# Patient Record
Sex: Female | Born: 2000 | Race: Black or African American | Hispanic: No | Marital: Single | Smoking: Never smoker
Health system: Southern US, Community
[De-identification: ages and names within clinical notes are randomized; demographics above are authoritative.]

## PROBLEM LIST (undated history)

## (undated) DIAGNOSIS — D571 Sickle-cell disease without crisis: Secondary | ICD-10-CM

---
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
## 2020-04-20 ENCOUNTER — Emergency Department (HOSPITAL_COMMUNITY): Payer: Medicaid Other

## 2020-04-20 ENCOUNTER — Emergency Department (HOSPITAL_COMMUNITY)
Admission: EM | Admit: 2020-04-20 | Discharge: 2020-04-20 | Disposition: A | Discharge status: Home / Self Care | Payer: Medicaid Other | Attending: Emergency Medicine | Admitting: Emergency Medicine

## 2020-04-20 ENCOUNTER — Encounter (HOSPITAL_COMMUNITY): Payer: Self-pay

## 2020-04-20 ENCOUNTER — Other Ambulatory Visit: Payer: Self-pay

## 2020-04-20 DIAGNOSIS — R5383 Other fatigue: Secondary | ICD-10-CM | POA: Insufficient documentation

## 2020-04-20 DIAGNOSIS — R059 Cough, unspecified: Secondary | ICD-10-CM | POA: Insufficient documentation

## 2020-04-20 DIAGNOSIS — D5700 Hb-SS disease with crisis, unspecified: Secondary | ICD-10-CM | POA: Insufficient documentation

## 2020-04-20 DIAGNOSIS — R0602 Shortness of breath: Secondary | ICD-10-CM | POA: Insufficient documentation

## 2020-04-20 DIAGNOSIS — R071 Chest pain on breathing: Secondary | ICD-10-CM

## 2020-04-20 DIAGNOSIS — Z20822 Contact with and (suspected) exposure to covid-19: Secondary | ICD-10-CM | POA: Insufficient documentation

## 2020-04-20 DIAGNOSIS — R0789 Other chest pain: Secondary | ICD-10-CM | POA: Diagnosis present

## 2020-04-20 LAB — COMPREHENSIVE METABOLIC PANEL
ALT: 52 U/L — ABNORMAL HIGH (ref 0–44)
AST: 72 U/L — ABNORMAL HIGH (ref 15–41)
Albumin: 4.3 g/dL (ref 3.5–5.0)
Alkaline Phosphatase: 54 U/L (ref 38–126)
Anion gap: 7 (ref 5–15)
BUN: 8 mg/dL (ref 6–20)
CO2: 25 mmol/L (ref 22–32)
Calcium: 8.9 mg/dL (ref 8.9–10.3)
Chloride: 103 mmol/L (ref 98–111)
Creatinine, Ser: 0.37 mg/dL — ABNORMAL LOW (ref 0.44–1.00)
GFR, Estimated: >60 mL/min (ref >60)
Glucose, Bld: 107 mg/dL — ABNORMAL HIGH (ref 70–99)
Potassium: 5.2 mmol/L — ABNORMAL HIGH (ref 3.5–5.1)
Sodium: 135 mmol/L (ref 135–145)
Total Bilirubin: 1.1 mg/dL (ref 0.3–1.2)
Total Protein: 8.6 g/dL — ABNORMAL HIGH (ref 6.5–8.1)

## 2020-04-20 LAB — D-DIMER, QUANTITATIVE: D-Dimer, Quant: 1.77 ug/mL-FEU — ABNORMAL HIGH (ref 0.00–0.50)

## 2020-04-20 LAB — CBC WITH DIFFERENTIAL/PLATELET
Abs Immature Granulocytes: 0.05 10*3/uL (ref 0.00–0.07)
Basophils Absolute: 0.1 10*3/uL (ref 0.0–0.1)
Basophils Relative: 1 %
Eosinophils Absolute: 0.2 10*3/uL (ref 0.0–0.5)
Eosinophils Relative: 2 %
HCT: 24.2 % — ABNORMAL LOW (ref 36.0–46.0)
Hemoglobin: 8.5 g/dL — ABNORMAL LOW (ref 12.0–15.0)
Immature Granulocytes: 1 %
Lymphocytes Relative: 33 %
Lymphs Abs: 3.6 10*3/uL (ref 0.7–4.0)
MCH: 30 pg (ref 26.0–34.0)
MCHC: 35.1 g/dL (ref 30.0–36.0)
MCV: 85.5 fL (ref 80.0–100.0)
Monocytes Absolute: 1.2 10*3/uL — ABNORMAL HIGH (ref 0.1–1.0)
Monocytes Relative: 11 %
Neutro Abs: 5.8 10*3/uL (ref 1.7–7.7)
Neutrophils Relative %: 52 %
Platelets: 438 10*3/uL — ABNORMAL HIGH (ref 150–400)
RBC: 2.83 MIL/uL — ABNORMAL LOW (ref 3.87–5.11)
RDW: 18.6 % — ABNORMAL HIGH (ref 11.5–15.5)
WBC: 10.8 10*3/uL — ABNORMAL HIGH (ref 4.0–10.5)
nRBC: 0.2 % (ref 0.0–0.2)

## 2020-04-20 LAB — URINALYSIS, ROUTINE W REFLEX MICROSCOPIC
Bacteria, UA: NONE SEEN
Bilirubin Urine: NEGATIVE
Glucose, UA: NEGATIVE mg/dL
Hgb urine dipstick: NEGATIVE
Ketones, ur: NEGATIVE mg/dL
Leukocytes,Ua: NEGATIVE
Nitrite: NEGATIVE
Protein, ur: NEGATIVE mg/dL
Specific Gravity, Urine: 1.011 (ref 1.005–1.030)
pH: 6 (ref 5.0–8.0)

## 2020-04-20 LAB — RETICULOCYTES
Immature Retic Fract: 29.7 % — ABNORMAL HIGH (ref 2.3–15.9)
RBC.: 2.86 MIL/uL — ABNORMAL LOW (ref 3.87–5.11)
Retic Count, Absolute: 270 10*3/uL — ABNORMAL HIGH (ref 19.0–186.0)
Retic Ct Pct: 9.5 % — ABNORMAL HIGH (ref 0.4–3.1)

## 2020-04-20 LAB — PROTIME-INR
INR: 1.1 (ref 0.8–1.2)
Prothrombin Time: 14.2 seconds (ref 11.4–15.2)

## 2020-04-20 LAB — RESPIRATORY PANEL BY RT PCR (FLU A&B, COVID)
Influenza A by PCR: NEGATIVE
Influenza B by PCR: NEGATIVE
SARS Coronavirus 2 by RT PCR: NEGATIVE

## 2020-04-20 LAB — APTT: aPTT: 29 seconds (ref 24–36)

## 2020-04-20 LAB — I-STAT BETA HCG BLOOD, ED (MC, WL, AP ONLY): I-stat hCG, quantitative: <5 m[IU]/mL (ref <5)

## 2020-04-20 LAB — GROUP A STREP BY PCR: Group A Strep by PCR: NOT DETECTED

## 2020-04-20 LAB — LACTIC ACID, PLASMA
Lactic Acid, Venous: 1 mmol/L (ref 0.5–1.9)
Lactic Acid, Venous: 0.9 mmol/L (ref 0.5–1.9)

## 2020-04-20 LAB — TROPONIN I (HIGH SENSITIVITY)
Troponin I (High Sensitivity): 2 ng/L (ref <18)
Troponin I (High Sensitivity): 3 ng/L (ref <18)

## 2020-04-20 MED ORDER — DEXTROSE-NACL 5-0.45 % IV SOLN: INTRAVENOUS | Status: DC

## 2020-04-20 MED ORDER — OXYCODONE HCL 5 MG PO TABS
5.0000 mg | ORAL_TABLET | Freq: Once | ORAL | Status: DC
  Filled 2020-04-20: qty 1

## 2020-04-20 MED ORDER — IOHEXOL 350 MG/ML SOLN
100.0000 mL | Freq: Once | INTRAVENOUS | Status: AC | PRN
  Administered 2020-04-20: 100 mL via INTRAVENOUS

## 2020-04-20 MED ORDER — HYDROMORPHONE HCL 1 MG/ML IJ SOLN
1.0000 mg | Freq: Once | INTRAMUSCULAR | Status: AC
  Administered 2020-04-20: 1 mg via INTRAVENOUS
  Filled 2020-04-20: qty 1

## 2020-04-20 NOTE — Discharge Instructions (Signed)
1.  Call your doctor to get a recheck within the next 1 to 4 days. 2.  Continue your home pain medication regimen. 3.  There is a very small amount of fluid next to the lung on the right.  This is likely causing your pain.  At this time, there is no sign of a blood clot or infection in this area.  Return to the emergency department immediately if you develop a fever, increased shortness of breath, worsening pain or other concerning symptoms.

## 2020-04-20 NOTE — ED Triage Notes (Signed)
Patient c/o sickle cell pain in her right chest and right rib cage area  X 4 days and SOB.

## 2020-04-20 NOTE — ED Provider Notes (Signed)
[facility]-EMERGENCY DEPT Provider Note   CSN: [id number] Arrival date & time: 04/20/20  1531     History Chief Complaint  Patient presents with  . Sickle Cell Pain Crisis    Leslie Kelly is a [age] y.o. female.  HPI Patient reports she has had pain in the right side of her posterior thoracic chest for about 4 days.  She reports initially she had some pain in her lower back centrally.  She reports that has migrated up to her central back and now is more concentrated to the right lower and mid thoracic back wrapping around to the front of the chest.  She reports the pain is sharp in nature.  It is worse with coughing, laughing, deep breaths.  She reports that she is sleeping on some pillows because is uncomfortable to lie on the area.  She reports she thought it might have been due to a back strain.  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  However, she has been taking ibuprofen around-the-clock and taking her oxycodone at night without relief.  She thought it would have gotten better by now thought was a back strain.  She reports she has some mild cough.  She has felt slightly more short of breath than usual.  She reports she gets fatigued and a little short of breath if she climbs 3 flights of stairs.  She also has had some sore throat.  She is been trying to drink tea to alleviate it.  She recently got her first Covid vaccine within the last week.  No lower extremity swelling or calf pain.  No history of PE or DVT.  No abdominal pain, nausea or vomiting.  No GU symptoms.    Past Medical History:  Diagnosis Date  . Sickle cell anemia (HCC)     There are no problems to display for this patient.   History reviewed. No pertinent surgical history.   OB History   No obstetric history on file.     Family History  Problem Relation Age of Onset  . Asthma Mother     Social History   Tobacco Use  . Smoking status: Never Smoker  . Smokeless  tobacco: Never Used  Vaping Use  . Vaping Use: Never used  Substance Use Topics  . Alcohol use: Never  . Drug use: Never    Home Medications Prior to Admission medications   Medication Sig Start Date End Date Taking? Authorizing Provider  fluticasone (FLONASE) 50 MCG/ACT nasal spray Place 2 sprays into the nose daily. 06/26/17  Yes [provider]  folic acid (FOLVITE) 1 MG tablet Take 1 mg by mouth daily. 07/22/17  Yes [provider]  hydroxyurea (HYDREA) 500 MG capsule Take 1,500 mg by mouth daily. 06/18/18  Yes [provider]  ibuprofen (ADVIL,MOTRIN) 400 MG tablet Take 400 mg by mouth every 6 (six) hours as needed for moderate pain.  06/18/18  Yes [provider]  oxyCODONE (OXY IR/ROXICODONE) 5 MG immediate release tablet Take 5 mg by mouth every 4 (four) hours as needed for severe pain.  06/18/18  Yes [provider]    Allergies    Patient has no known allergies.  Review of Systems   Review of Systems 10 systems reviewed and negative except as per HPI Physical Exam Updated Vital Signs BP (!) 182/103   Pulse 68   Temp 99.1 F (37.3 C) (Oral)   Resp (!) 22   Ht 5'  2" (1.575 m)   Wt 54.4 kg   SpO2 99%   BMI 21.95 kg/m   Physical Exam Constitutional:      Comments: Alert nontoxic and clinically well in appearance.  No respiratory distress.  Well-nourished well-developed.  HENT:     Head: Normocephalic and atraumatic.     Nose: Nose normal.     Mouth/Throat:     Comments: Mucous membranes are pink and moist.  Patient does have a large tonsils bilaterally. Eyes:     Extraocular Movements: Extraocular movements intact.     Conjunctiva/sclera: Conjunctivae normal.     Pupils: Pupils are equal, round, and reactive to light.  Cardiovascular:     Rate and Rhythm: Normal rate and regular rhythm.     Heart sounds: Normal heart sounds.  Pulmonary:     Effort: Pulmonary effort is normal.     Breath sounds: Normal breath  sounds.     Comments: Reproducible chest wall tenderness to palpation of the thoracic back from the mid rib cage to the lower rib cage posteriorly to anteriorly.  No palpable soft tissue abnormalities.  No crepitus.  No rash. Chest:     Chest wall: Tenderness present.  Abdominal:     General: There is no distension.     Palpations: Abdomen is soft.     Tenderness: There is no abdominal tenderness. There is no guarding.  Musculoskeletal:        General: No swelling or tenderness. Normal range of motion.     Cervical back: Neck supple.     Right lower leg: No edema.     Left lower leg: No edema.  Skin:    General: Skin is warm and dry.     Findings: No rash.  Neurological:     General: No focal deficit present.     Mental Status: She is oriented to person, place, and time.     Coordination: Coordination normal.  Psychiatric:        Mood and Affect: Mood normal.     ED Results / Procedures / Treatments   Labs (all labs ordered are listed, but only abnormal results are displayed) Labs Reviewed  COMPREHENSIVE METABOLIC PANEL - Abnormal; Notable for the following components:      Result Value   Potassium 5.2 (*)    Glucose, Bld 107 (*)    Creatinine, Ser 0.37 (*)    Total Protein 8.6 (*)    AST 72 (*)    ALT 52 (*)    All other components within normal limits  CBC WITH DIFFERENTIAL/PLATELET - Abnormal; Notable for the following components:   WBC 10.8 (*)    RBC 2.83 (*)    Hemoglobin 8.5 (*)    HCT 24.2 (*)    RDW 18.6 (*)    Platelets 438 (*)    Monocytes Absolute 1.2 (*)    All other components within normal limits  RETICULOCYTES - Abnormal; Notable for the following components:   Retic Ct Pct 9.5 (*)    RBC. 2.86 (*)    Retic Count, Absolute 270.0 (*)    Immature Retic Fract 29.7 (*)    All other components within normal limits  D-DIMER, QUANTITATIVE (NOT AT [facility]) - Abnormal; Notable for the following components:   D-Dimer, Quant 1.77 (*)    All other components  within normal limits  RESPIRATORY PANEL BY RT PCR (FLU A&B, COVID)  GROUP A STREP BY PCR  LACTIC ACID, PLASMA  LACTIC ACID, PLASMA  PROTIME-INR  APTT  URINALYSIS, ROUTINE W REFLEX MICROSCOPIC  I-STAT BETA HCG BLOOD, ED ([facility], [facility], [facility] ONLY)  TROPONIN I (HIGH SENSITIVITY)  TROPONIN I (HIGH SENSITIVITY)    EKG EKG Interpretation  Date/Time:  Thursday April 20 2020 15:47:04 EDT Ventricular Rate:  85 PR Interval:    QRS Duration: 74 QT Interval:  359 QTC Calculation: 427 R Axis:   77 Text Interpretation: Sinus rhythm Borderline T wave abnormalities probable LVH, no acute ischemic appearance. no old comparison Confirmed by Arby Barrette [phone]) on 04/20/2020 5:58:28 PM   Radiology DG Chest 2 View  Result Date: 04/20/2020 CLINICAL DATA:  Chest pain on the right for several days, initial encounter EXAM: CHEST - 2 VIEW COMPARISON:  None. FINDINGS: Cardiac shadow is within normal limits. The lungs are. No bony abnormality is seen. IMPRESSION: No acute abnormality noted. Electronically Signed   By: Alcide Clever M.D.   On: 04/20/2020 16:18   CT Angio Chest PE W/Cm &/Or Wo Cm  Result Date: 04/20/2020 CLINICAL DATA:  History of sickle cell with right-sided chest pain and shortness of breath EXAM: CT ANGIOGRAPHY CHEST WITH CONTRAST TECHNIQUE: Multidetector CT imaging of the chest was performed using the standard protocol during bolus administration of intravenous contrast. Multiplanar CT image reconstructions and MIPs were obtained to evaluate the vascular anatomy. CONTRAST:  OMNIPAQUE IOHEXOL 350 MG/ML SOLN COMPARISON:  Chest x-ray from earlier in the same day. FINDINGS: Cardiovascular: Thoracic aorta demonstrates no aneurysmal dilatation or dissection. No cardiac enlargement is noted. No coronary calcifications are seen. The pulmonary artery shows a normal branching pattern bilaterally. No filling defect to suggest pulmonary embolism is noted. Mediastinum/Nodes: Thoracic inlet is within  normal limits. No hilar or mediastinal adenopathy is noted. The esophagus as visualized is within normal limits. Lungs/Pleura: Small right-sided pleural effusion is noted. Minimal bibasilar atelectasis is seen. No focal parenchymal nodules are noted. Upper Abdomen: No acute abnormality. Musculoskeletal: No chest wall abnormality. No acute or significant osseous findings. Review of the MIP images confirms the above findings. IMPRESSION: No evidence of pulmonary emboli. Small right pleural effusion and mild bibasilar atelectasis. Electronically Signed   By: Alcide Clever M.D.   On: 04/20/2020 20:33    Procedures Procedures (including critical care time)  Medications Ordered in ED Medications  dextrose 5 %-0.45 % sodium chloride infusion ( Intravenous New Bag/Given 04/20/20 1709)  oxyCODONE (Oxy IR/ROXICODONE) immediate release tablet 5 mg (has no administration in time range)  HYDROmorphone (DILAUDID) injection 1 mg (1 mg Intravenous Given 04/20/20 1644)  iohexol (OMNIPAQUE) 350 MG/ML injection 100 mL (100 mLs Intravenous Contrast Given 04/20/20 2012)    ED Course  I have reviewed the triage vital signs and the nursing notes.  Pertinent labs & imaging results that were available during my care of the patient were reviewed by me and considered in my medical decision making (see chart for details).    MDM Rules/Calculators/A&P                          Patient presents with history of sickle cell anemia.  She has had 4 days of right lateral chest pain.  This is a very pleuritic quality but also very reproducible on exam.  Patient is clinically well.  She does not have hypoxia, tachycardia, productive cough or general malaise.  I had concern for possible PE.  D-dimer mildly elevated.  PE study is negative except for very small pleural effusion without evident adjacent infection.  Patient does not have significant leukocytosis or lactic acidosis.  Low suspicion at this time for empyema or infectious  etiology.  It is completely nontender.  This time, patient got significant pain relief in the emergency department.  Plan will be for continued outpatient pain control and close follow-up with PCP.  Return precautions reviewed. Final Clinical Impression(s) / ED Diagnoses Final diagnoses:  Sickle cell crisis (HCC)  Chest pain on breathing    Rx / DC Orders ED Discharge Orders    None       Arby Barrette, MD 04/20/20 2116

## 2020-07-28 ENCOUNTER — Emergency Department (HOSPITAL_COMMUNITY): Payer: Medicaid Other

## 2020-07-28 ENCOUNTER — Other Ambulatory Visit: Payer: Self-pay

## 2020-07-28 ENCOUNTER — Encounter (HOSPITAL_COMMUNITY): Payer: Self-pay

## 2020-07-28 ENCOUNTER — Emergency Department (HOSPITAL_COMMUNITY)
Admission: EM | Admit: 2020-07-28 | Discharge: 2020-07-28 | Disposition: A | Discharge status: Home / Self Care | Payer: Medicaid Other | Attending: Emergency Medicine | Admitting: Emergency Medicine

## 2020-07-28 DIAGNOSIS — D5700 Hb-SS disease with crisis, unspecified: Secondary | ICD-10-CM | POA: Diagnosis present

## 2020-07-28 LAB — RETICULOCYTES
Immature Retic Fract: 35.6 % — ABNORMAL HIGH (ref 2.3–15.9)
RBC.: 2.68 MIL/uL — ABNORMAL LOW (ref 3.87–5.11)
Retic Count, Absolute: 301 10*3/uL — ABNORMAL HIGH (ref 19.0–186.0)
Retic Ct Pct: 10.9 % — ABNORMAL HIGH (ref 0.4–3.1)

## 2020-07-28 LAB — CBC WITH DIFFERENTIAL/PLATELET
Abs Immature Granulocytes: 0.11 10*3/uL — ABNORMAL HIGH (ref 0.00–0.07)
Basophils Absolute: 0.1 10*3/uL (ref 0.0–0.1)
Basophils Relative: 1 %
Eosinophils Absolute: 0.4 10*3/uL (ref 0.0–0.5)
Eosinophils Relative: 3 %
HCT: 23.1 % — ABNORMAL LOW (ref 36.0–46.0)
Hemoglobin: 8.3 g/dL — ABNORMAL LOW (ref 12.0–15.0)
Immature Granulocytes: 1 %
Lymphocytes Relative: 43 %
Lymphs Abs: 5.2 10*3/uL — ABNORMAL HIGH (ref 0.7–4.0)
MCH: 30.9 pg (ref 26.0–34.0)
MCHC: 35.9 g/dL (ref 30.0–36.0)
MCV: 85.9 fL (ref 80.0–100.0)
Monocytes Absolute: 1.1 10*3/uL — ABNORMAL HIGH (ref 0.1–1.0)
Monocytes Relative: 9 %
Neutro Abs: 5.1 10*3/uL (ref 1.7–7.7)
Neutrophils Relative %: 43 %
Platelets: 413 10*3/uL — ABNORMAL HIGH (ref 150–400)
RBC: 2.69 MIL/uL — ABNORMAL LOW (ref 3.87–5.11)
RDW: 18.8 % — ABNORMAL HIGH (ref 11.5–15.5)
WBC: 12 10*3/uL — ABNORMAL HIGH (ref 4.0–10.5)
nRBC: 0.6 % — ABNORMAL HIGH (ref 0.0–0.2)

## 2020-07-28 LAB — COMPREHENSIVE METABOLIC PANEL
ALT: 40 U/L (ref 0–44)
AST: 51 U/L — ABNORMAL HIGH (ref 15–41)
Albumin: 4.6 g/dL (ref 3.5–5.0)
Alkaline Phosphatase: 62 U/L (ref 38–126)
Anion gap: 10 (ref 5–15)
BUN: 10 mg/dL (ref 6–20)
CO2: 22 mmol/L (ref 22–32)
Calcium: 9.1 mg/dL (ref 8.9–10.3)
Chloride: 106 mmol/L (ref 98–111)
Creatinine, Ser: 0.45 mg/dL (ref 0.44–1.00)
GFR, Estimated: >60 mL/min (ref >60)
Glucose, Bld: 97 mg/dL (ref 70–99)
Potassium: 4.1 mmol/L (ref 3.5–5.1)
Sodium: 138 mmol/L (ref 135–145)
Total Bilirubin: 1.4 mg/dL — ABNORMAL HIGH (ref 0.3–1.2)
Total Protein: 8.3 g/dL — ABNORMAL HIGH (ref 6.5–8.1)

## 2020-07-28 LAB — I-STAT BETA HCG BLOOD, ED (MC, WL, AP ONLY): I-stat hCG, quantitative: <5 m[IU]/mL (ref <5)

## 2020-07-28 MED ORDER — OXYCODONE HCL 5 MG PO TABS
15.0000 mg | ORAL_TABLET | Freq: Once | ORAL | Status: DC
  Filled 2020-07-28: qty 3

## 2020-07-28 MED ORDER — HYDROMORPHONE HCL 1 MG/ML IJ SOLN
1.0000 mg | INTRAMUSCULAR | Status: AC
  Administered 2020-07-28: 1 mg via SUBCUTANEOUS
  Filled 2020-07-28: qty 1

## 2020-07-28 MED ORDER — DIPHENHYDRAMINE HCL 50 MG/ML IJ SOLN
25.0000 mg | Freq: Once | INTRAMUSCULAR | Status: AC
  Administered 2020-07-28: 25 mg via INTRAVENOUS
  Filled 2020-07-28: qty 1

## 2020-07-28 MED ORDER — HYDROMORPHONE HCL 1 MG/ML IJ SOLN
1.0000 mg | Freq: Once | INTRAMUSCULAR | Status: AC
  Administered 2020-07-28: 1 mg via INTRAVENOUS
  Filled 2020-07-28: qty 1

## 2020-07-28 MED ORDER — ONDANSETRON HCL 4 MG/2ML IJ SOLN: 4.0000 mg | INTRAMUSCULAR | Status: DC | PRN

## 2020-07-28 MED ORDER — SODIUM CHLORIDE 0.9 % IV BOLUS
1000.0000 mL | Freq: Once | INTRAVENOUS | Status: AC
  Administered 2020-07-28: 1000 mL via INTRAVENOUS

## 2020-07-28 MED ORDER — HYDROMORPHONE HCL 1 MG/ML IJ SOLN: 1.0000 mg | INTRAMUSCULAR | Status: DC

## 2020-07-28 NOTE — Discharge Instructions (Signed)
You are seen you for sickle cell pain crisis.  Lab work and imaging all looks reassuring.  I recommend continue taking your medications as prescribed.  I would like you to follow-up with your sickle cell doctor for further evaluation.  Come back to the emergency department if you develop chest pain, shortness of breath, severe abdominal pain, uncontrolled nausea, vomiting, diarrhea.

## 2020-07-28 NOTE — ED Provider Notes (Signed)
[facility]-EMERGENCY DEPT Provider Note   CSN: [id number] Arrival date & time: 07/28/20  1154     History Chief Complaint  Patient presents with  . Sickle Cell Pain Crisis    Leslie Kelly is a 20 y.o. female.  HPI   Patient with significant medical history of sickle cell disease Hb SS presents with chief complaint of sickle cell pain crisis.  She endorses pain that initially started in her right leg and then migrate up into her right arm.  She states this is typical for her as her pain crisis that will migrate, there is no specific spot for her, she endorses that it started yesterday and had she has been unable to control her pain with her prescribed pain medications.  She denies chest pain, shortness of breath, headaches, change in vision, paresthesias or weakness in the upper or lower extremities.  She has no abdominal pain, nausea, vomiting, diarrhea, is tolerating p.o. without difficulty.  Patient denies seeing erythematous or swollen joints, has no history of PEs or DVTs, has no history of strokes or cardiac abnormalities.  She is recently hospitalized for a sickle cell pain crisis on 11/02, pain originated on the left side had imaging of her left which was negative.  She was later discharged home after pain was controlled.  She is currently vaccinated against  COVID-19, denies any recent sick contacts, denies alleviating factors.  Patient denies headaches, fevers, chills, shortness of breath, chest pain, abdominal pain, nausea, vomiting, diarrhea, pedal edema.  Past Medical History:  Diagnosis Date  . Sickle cell anemia (HCC)     There are no problems to display for this patient.   History reviewed. No pertinent surgical history.   OB History   No obstetric history on file.     Family History  Problem Relation Age of Onset  . Asthma Mother     Social History   Tobacco Use  . Smoking status: Never Smoker  . Smokeless tobacco: Never Used   Vaping Use  . Vaping Use: Never used  Substance Use Topics  . Alcohol use: Never  . Drug use: Never    Home Medications Prior to Admission medications   Medication Sig Start Date End Date Taking? Authorizing Provider  fluticasone (FLONASE) 50 MCG/ACT nasal spray Place 2 sprays into both nostrils daily. 06/26/17  Yes [provider]  folic acid (FOLVITE) 1 MG tablet Take 1 mg by mouth daily. 07/22/17  Yes [provider]  hydroxyurea (HYDREA) 500 MG capsule Take 2,000 mg by mouth daily. 06/18/18  Yes [provider]  ibuprofen (ADVIL,MOTRIN) 400 MG tablet Take 400 mg by mouth every 6 (six) hours as needed for moderate pain.  06/18/18  Yes [provider]  oxyCODONE (OXY IR/ROXICODONE) 5 MG immediate release tablet Take 5 mg by mouth every 4 (four) hours as needed for severe pain.  06/18/18  Yes [provider]    Allergies    Patient has no known allergies.  Review of Systems   Review of Systems  Constitutional: Negative for chills and fever.  HENT: Negative for congestion and sore throat.   Respiratory: Negative for cough and shortness of breath.   Cardiovascular: Negative for chest pain.  Gastrointestinal: Negative for abdominal pain, diarrhea, nausea and vomiting.  Genitourinary: Negative for enuresis.  Musculoskeletal: Negative for back pain.       Right mid bicep pain and right lateral knee pain  Skin: Negative for rash.  Neurological: Negative for dizziness  and headaches.  Hematological: Does not bruise/bleed easily.    Physical Exam Updated Vital Signs BP (!) 105/58   Pulse 70   Temp 98.6 F (37 C) (Oral)   Resp 15   Ht 5\' 2"  (1.575 m)   Wt 53.5 kg   LMP 07/28/2020   SpO2 95%   BMI 21.58 kg/m   Physical Exam Vitals and nursing note reviewed.  Constitutional:      General: She is not in acute distress.    Appearance: She is not ill-appearing.  HENT:     Head: Normocephalic and atraumatic.     Nose: No  congestion.  Eyes:     Conjunctiva/sclera: Conjunctivae normal.  Cardiovascular:     Rate and Rhythm: Normal rate and regular rhythm.     Pulses: Normal pulses.     Heart sounds: No murmur heard. No friction rub. No gallop.   Pulmonary:     Effort: No respiratory distress.     Breath sounds: No wheezing, rhonchi or rales.  Abdominal:     Palpations: Abdomen is soft.     Tenderness: There is no abdominal tenderness.  Musculoskeletal:     Comments: Patient is moving all 4 extremities at difficulty.  She had full range of motion in her right shoulder, elbow, wrist, fingers, full range of motion her right hip flexor, knee, ankle, toes.  Neurovascular fully intact in all 4 extremities.  Skin:    General: Skin is warm and dry.     Comments: Skin exam was performed all joints were visualized, there is no erythema or edema noted in joints.  There is no rashes, abrasions, ecchymosis, petechia noted on patient's abdomen, back, upper or lower extremities.  Neurological:     Mental Status: She is alert.     Comments: Patient is having difficulty with word finding.  Psychiatric:        Mood and Affect: Mood normal.     ED Results / Procedures / Treatments   Labs (all labs ordered are listed, but only abnormal results are displayed) Labs Reviewed  COMPREHENSIVE METABOLIC PANEL - Abnormal; Notable for the following components:      Result Value   Total Protein 8.3 (*)    AST 51 (*)    Total Bilirubin 1.4 (*)    All other components within normal limits  CBC WITH DIFFERENTIAL/PLATELET - Abnormal; Notable for the following components:   WBC 12.0 (*)    RBC 2.69 (*)    Hemoglobin 8.3 (*)    HCT 23.1 (*)    RDW 18.8 (*)    Platelets 413 (*)    nRBC 0.6 (*)    Lymphs Abs 5.2 (*)    Monocytes Absolute 1.1 (*)    Abs Immature Granulocytes 0.11 (*)    All other components within normal limits  RETICULOCYTES - Abnormal; Notable for the following components:   Retic Ct Pct 10.9 (*)    RBC.  2.68 (*)    Retic Count, Absolute 301.0 (*)    Immature Retic Fract 35.6 (*)    All other components within normal limits  I-STAT BETA HCG BLOOD, ED ([facility], [facility], [facility] ONLY)    EKG None  Radiology DG Chest Port 1 View  Result Date: 07/28/2020 CLINICAL DATA:  Sickle cell pain including the right arm and right leg. EXAM: PORTABLE CHEST 1 VIEW COMPARISON:  CT chest 04/20/2020 FINDINGS: Borderline prominence of the main pulmonary artery. The lungs appear clear. No blunting of the costophrenic angles.  No acute thoracic findings are identified. IMPRESSION: 1. Borderline prominence of the main pulmonary artery. Otherwise, no significant abnormalities are observed. Electronically Signed   By: Gaylyn Rong M.D.   On: 07/28/2020 13:06    Procedures Procedures   Medications Ordered in ED Medications  oxyCODONE (Oxy IR/ROXICODONE) immediate release tablet 15 mg (has no administration in time range)  ondansetron (ZOFRAN) injection 4 mg (has no administration in time range)  HYDROmorphone (DILAUDID) injection 1 mg (1 mg Subcutaneous Given 07/28/20 1303)  diphenhydrAMINE (BENADRYL) injection 25 mg (25 mg Intravenous Given 07/28/20 1256)  sodium chloride 0.9 % bolus 1,000 mL (0 mLs Intravenous Stopped 07/28/20 1416)  HYDROmorphone (DILAUDID) injection 1 mg (1 mg Intravenous Given 07/28/20 1413)    ED Course  I have reviewed the triage vital signs and the nursing notes.  Pertinent labs & imaging results that were available during my care of the patient were reviewed by me and considered in my medical decision making (see chart for details).    MDM Rules/Calculators/A&P                          Patient presents emerged part with sickle cell pain crisis.  She is alert, does not appear acute distress, vital signs reassuring.  Will obtain sickle cell pain crisis lab work-up, add on chest x-ray EKG.  Patient is opiate tolerant will provide her with Dilaudid, Benadryl, Zofran, and provide fluids.  Will  reevaluate.  Patient was reassessed after providing her with her second round of Dilaudid, she states her pain has improved but still continues to have right arm and right leg pain.  Vital signs have remained stable, lab work is reassuring.  Will continue to monitor if pain is not under control after last round of pain medications will consult with hospitalist for admission.  Patient is reassessed, states her pain has fully resolved, has no complaints at this time.  Vital signs have remained stable.  I was personally in the room when I obtain patient's blood pressure, read 110/53, heart rate and O2 sats have remained consistent  CBC shows slight leukocytosis of 12.0, normocytic anemia appears to be a baseline for patient.  CMP shows no electrolyte abnormalities, slight elevation AST, increasing total bili 1.4, Gap present.  I-STAT hCG less than 5.  Reticulocyte count remains unchanged from prior. chest x-ray shows no acute findings other than borderline prominent pulmonary artery.  EKG sinus rhythm without signs of ischemia no ST elevation depression.  Likely that patient's pain is a result of sickle cell crisis. Patient does not complain of CP, abdominal pain, or SOB. Pt is not exhibiting signs or symptoms of acute chest syndrome, organ failure, or DVT. Pt is afebrile, hemodynamically stable patient does have a slight increase in her white count but I suspect this is secondary due to acute phase reactants, as there is no signs of infection on exam or indicated with lab work.. Retic appropriately elevated. Hgb without change from baseline. Patient treated with sickle cell protocol, pain has improved. Pt tolerating PO.  Vital signs have remained stable, no indication for hospital admission.  Patient discussed with attending and they agreed with assessment and plan.  Patient given at home care as well strict return precautions.  Patient verbalized that they understood agreed to said plan.   Final  Clinical Impression(s) / ED Diagnoses Final diagnoses:  Sickle cell pain crisis (HCC)    Rx / DC Orders ED Discharge Orders  None       Barnie Del 07/28/20 1614    Gerhard Munch, MD 07/28/20 [phone]

## 2020-07-28 NOTE — ED Triage Notes (Signed)
Patient c/o sickle cell pain of the right arm and right legs since last night.

## 2021-04-03 ENCOUNTER — Emergency Department (HOSPITAL_COMMUNITY)
Admission: EM | Admit: 2021-04-03 | Discharge: 2021-04-03 | Disposition: A | Discharge status: Home / Self Care | Payer: Medicaid Other | Attending: Emergency Medicine | Admitting: Emergency Medicine

## 2021-04-03 ENCOUNTER — Encounter (HOSPITAL_COMMUNITY): Payer: Self-pay

## 2021-04-03 DIAGNOSIS — D5700 Hb-SS disease with crisis, unspecified: Secondary | ICD-10-CM | POA: Diagnosis present

## 2021-04-03 LAB — CBC WITH DIFFERENTIAL/PLATELET
Abs Immature Granulocytes: 0.01 10*3/uL (ref 0.00–0.07)
Basophils Absolute: 0.1 10*3/uL (ref 0.0–0.1)
Basophils Relative: 1 %
Eosinophils Absolute: 0.2 10*3/uL (ref 0.0–0.5)
Eosinophils Relative: 3 %
HCT: 22.1 % — ABNORMAL LOW (ref 36.0–46.0)
Hemoglobin: 8.1 g/dL — ABNORMAL LOW (ref 12.0–15.0)
Immature Granulocytes: 0 %
Lymphocytes Relative: 48 %
Lymphs Abs: 2.7 10*3/uL (ref 0.7–4.0)
MCH: 34.3 pg — ABNORMAL HIGH (ref 26.0–34.0)
MCHC: 36.7 g/dL — ABNORMAL HIGH (ref 30.0–36.0)
MCV: 93.6 fL (ref 80.0–100.0)
Monocytes Absolute: 0.5 10*3/uL (ref 0.1–1.0)
Monocytes Relative: 8 %
Neutro Abs: 2.2 10*3/uL (ref 1.7–7.7)
Neutrophils Relative %: 40 %
Platelets: 265 10*3/uL (ref 150–400)
RBC: 2.36 MIL/uL — ABNORMAL LOW (ref 3.87–5.11)
RDW: 17.4 % — ABNORMAL HIGH (ref 11.5–15.5)
WBC: 5.5 10*3/uL (ref 4.0–10.5)
nRBC: 0.4 % — ABNORMAL HIGH (ref 0.0–0.2)

## 2021-04-03 LAB — COMPREHENSIVE METABOLIC PANEL
ALT: 21 U/L (ref 0–44)
AST: 30 U/L (ref 15–41)
Albumin: 4.3 g/dL (ref 3.5–5.0)
Alkaline Phosphatase: 49 U/L (ref 38–126)
Anion gap: 5 (ref 5–15)
BUN: 8 mg/dL (ref 6–20)
CO2: 26 mmol/L (ref 22–32)
Calcium: 9.2 mg/dL (ref 8.9–10.3)
Chloride: 109 mmol/L (ref 98–111)
Creatinine, Ser: 0.35 mg/dL — ABNORMAL LOW (ref 0.44–1.00)
GFR, Estimated: >60 mL/min (ref >60)
Glucose, Bld: 97 mg/dL (ref 70–99)
Potassium: 3.8 mmol/L (ref 3.5–5.1)
Sodium: 140 mmol/L (ref 135–145)
Total Bilirubin: 1.2 mg/dL (ref 0.3–1.2)
Total Protein: 8.2 g/dL — ABNORMAL HIGH (ref 6.5–8.1)

## 2021-04-03 LAB — I-STAT BETA HCG BLOOD, ED (MC, WL, AP ONLY): I-stat hCG, quantitative: <5 m[IU]/mL (ref <5)

## 2021-04-03 LAB — RETICULOCYTES
Immature Retic Fract: 27.6 % — ABNORMAL HIGH (ref 2.3–15.9)
RBC.: 2.31 MIL/uL — ABNORMAL LOW (ref 3.87–5.11)
Retic Count, Absolute: 113.9 10*3/uL (ref 19.0–186.0)
Retic Ct Pct: 4.9 % — ABNORMAL HIGH (ref 0.4–3.1)

## 2021-04-03 MED ORDER — MORPHINE SULFATE 15 MG PO TABS
15.0000 mg | ORAL_TABLET | Freq: Once | ORAL | Status: AC
Start: 2021-04-03 — End: 2021-04-03
  Administered 2021-04-03: 15 mg via ORAL
  Filled 2021-04-03: qty 1

## 2021-04-03 MED ORDER — MORPHINE SULFATE (PF) 4 MG/ML IV SOLN: 8.0000 mg | INTRAVENOUS | Status: DC

## 2021-04-03 MED ORDER — FENTANYL CITRATE PF 50 MCG/ML IJ SOSY
50.0000 ug | PREFILLED_SYRINGE | Freq: Once | INTRAMUSCULAR | Status: AC
  Administered 2021-04-03: 50 ug via INTRAVENOUS
  Filled 2021-04-03: qty 1

## 2021-04-03 MED ORDER — SODIUM CHLORIDE 0.9 % IV BOLUS
500.0000 mL | Freq: Once | INTRAVENOUS | Status: AC
  Administered 2021-04-03: 500 mL via INTRAVENOUS

## 2021-04-03 MED ORDER — MORPHINE SULFATE (PF) 4 MG/ML IV SOLN
6.0000 mg | INTRAVENOUS | Status: AC
  Administered 2021-04-03: 6 mg via INTRAVENOUS
  Filled 2021-04-03: qty 2

## 2021-04-03 MED ORDER — HYDROMORPHONE HCL 1 MG/ML IJ SOLN
1.0000 mg | Freq: Once | INTRAMUSCULAR | Status: AC
  Administered 2021-04-03: 1 mg via INTRAVENOUS
  Filled 2021-04-03: qty 1

## 2021-04-03 MED ORDER — SODIUM CHLORIDE 0.45 % IV SOLN: INTRAVENOUS | Status: DC

## 2021-04-03 MED ORDER — DIPHENHYDRAMINE HCL 25 MG PO CAPS: 25.0000 mg | ORAL_CAPSULE | ORAL | Status: DC | PRN

## 2021-04-03 NOTE — Discharge Instructions (Addendum)
Please follow-up with the sickle cell pain crisis here.  Contact their clinic tomorrow morning to request an appointment.  If you develop uncontrolled pain, fever, difficulty breathing or other new concerning symptom, come back to ER for reassessment.

## 2021-04-03 NOTE — ED Provider Notes (Signed)
Signout note  20 year old with sickle cell anemia.  Presenting to ER with concern for pain crisis.  At time of signout patient still having ongoing sickle cell pain.  4:00 PM received signout from Dr. Rhunette Croft  7:25 PM pain improved, BP stable after some fluids, patient remains well-appearing in no distress, will discharge home and recommend follow-up with sickle cell clinic in the outpatient setting   Milagros Loll, MD 04/03/21 1926

## 2021-04-03 NOTE — ED Triage Notes (Signed)
Pt presents with c/o sickle cell pain in her arms, legs, and back. Pt reports pain has been present for approx 3 days. Pt has been taking home meds with no relief. Pt also reporting cold symptoms.

## 2021-04-03 NOTE — ED Provider Notes (Signed)
[facility]-EMERGENCY DEPT Provider Note   CSN: [id number] Arrival date & time: 04/03/21  1415     History Chief Complaint  Patient presents with   Sickle Cell Pain Crisis    Leslie Kelly is a 20 y.o. female.  HPI     20 year old female comes in with chief complaint of sickle cell pain.  She has history of sickle cell anemia for which she takes ibuprofen and as needed oxycodone.  Her current pain started about a week ago, 3 days ago she started taking Percocets for breakthrough pain without significant relief.  Her pain is located in the upper extremities and lower extremities.  Pain is described as sharp and throbbing, typical of her sickle cell.  She thinks that the pain is because of the sudden weather changes we have had recently.  She denies any nausea, vomiting but does indicate that her p.o. intake is reduced.  Sickle cell in general is well controlled.  Past Medical History:  Diagnosis Date   Sickle cell anemia (HCC)     There are no problems to display for this patient.   History reviewed. No pertinent surgical history.   OB History   No obstetric history on file.     Family History  Problem Relation Age of Onset   Asthma Mother     Social History   Tobacco Use   Smoking status: Never   Smokeless tobacco: Never  Vaping Use   Vaping Use: Never used  Substance Use Topics   Alcohol use: Never   Drug use: Never    Home Medications Prior to Admission medications   Medication Sig Start Date End Date Taking? Authorizing Provider  fluticasone (FLONASE) 50 MCG/ACT nasal spray Place 2 sprays into both nostrils daily. 06/26/17  Yes [provider]  folic acid (FOLVITE) 1 MG tablet Take 1 mg by mouth daily. 07/22/17  Yes [provider]  hydroxyurea (HYDREA) 500 MG capsule Take 2,000 mg by mouth daily. 06/18/18  Yes [provider]  ibuprofen (ADVIL,MOTRIN) 400 MG tablet Take 400 mg by mouth every 6 (six) hours  as needed for moderate pain.  06/18/18  Yes [provider]  oxyCODONE (OXY IR/ROXICODONE) 5 MG immediate release tablet Take 10 mg by mouth every 4 (four) hours as needed for severe pain. 06/18/18  Yes [provider]    Allergies    Patient has no known allergies.  Review of Systems   Review of Systems  Constitutional:  Positive for activity change.  Respiratory:  Negative for cough and shortness of breath.   Cardiovascular:  Negative for chest pain.  Gastrointestinal:  Positive for nausea.  Musculoskeletal:  Positive for arthralgias and myalgias.  All other systems reviewed and are negative.  Physical Exam Updated Vital Signs BP 104/68   Pulse 95   Temp 98.2 F (36.8 C) (Oral)   Resp 19   LMP 03/20/2021 (Approximate)   SpO2 100%   Physical Exam Vitals and nursing note reviewed.  Constitutional:      Appearance: She is well-developed.  HENT:     Head: Atraumatic.  Eyes:     General: No scleral icterus.    Extraocular Movements: Extraocular movements intact.     Pupils: Pupils are equal, round, and reactive to light.  Cardiovascular:     Rate and Rhythm: Normal rate.  Pulmonary:     Effort: Pulmonary effort is normal.  Musculoskeletal:     Cervical back: Normal range of motion and neck  supple.  Skin:    General: Skin is warm and dry.  Neurological:     Mental Status: She is alert and oriented to person, place, and time.    ED Results / Procedures / Treatments   Labs (all labs ordered are listed, but only abnormal results are displayed) Labs Reviewed  CBC WITH DIFFERENTIAL/PLATELET - Abnormal; Notable for the following components:      Result Value   RBC 2.36 (*)    Hemoglobin 8.1 (*)    HCT 22.1 (*)    MCH 34.3 (*)    MCHC 36.7 (*)    RDW 17.4 (*)    nRBC 0.4 (*)    All other components within normal limits  COMPREHENSIVE METABOLIC PANEL  RETICULOCYTES  I-STAT BETA HCG BLOOD, ED ([facility], [facility], [facility] ONLY)    EKG None  Radiology No  results found.  Procedures Procedures   Medications Ordered in ED Medications  0.45 % sodium chloride infusion ( Intravenous New Bag/Given 04/03/21 1454)  morphine 4 MG/ML injection 8 mg (has no administration in time range)  morphine (MSIR) tablet 15 mg (has no administration in time range)  diphenhydrAMINE (BENADRYL) capsule 25-50 mg (has no administration in time range)  morphine 4 MG/ML injection 6 mg (6 mg Intravenous Given 04/03/21 1455)    ED Course  I have reviewed the triage vital signs and the nursing notes.  Pertinent labs & imaging results that were available during my care of the patient were reviewed by me and considered in my medical decision making (see chart for details).    MDM Rules/Calculators/A&P                           20 year old female comes in with chief complaint of sickle cell pain.  She is having pain over her extremities and back, typical sites and quality for her sickle cell pain.  Specific evoking factor is a weather change.  Will initiate her sickle cell pain control protocol and also get basic labs.  Patient's care will be signed out to incoming team.  Final Clinical Impression(s) / ED Diagnoses Final diagnoses:  Sickle cell pain crisis [facility])    Rx / DC Orders ED Discharge Orders     None        Derwood Kaplan, MD 04/03/21 1532

## 2021-04-03 NOTE — ED Notes (Signed)
   04/03/21 1552  Vitals  BP 92/65  MAP (mmHg) 74  Pulse Rate 78  ECG Heart Rate 75  Resp 16  MEWS COLOR  MEWS Score Color Green  Oxygen Therapy  SpO2 100 %  MEWS Score  MEWS Temp 0  MEWS Systolic 1  MEWS Pulse 0  MEWS RR 0  MEWS LOC 0  MEWS Score 1  Nanavati, EDP made aware of patient soft pressure.  Pain medication changed.

## 2021-04-03 NOTE — ED Notes (Signed)
Pt care taken, feeling better, said that it hurts still in her arms.

## 2022-05-17 IMAGING — DX DG CHEST 1V PORT
1 series · 1 of 1 positions shown · non-contrast
Comparison: CT chest 04/20/2020

CLINICAL DATA: Sickle cell pain including the right arm and right
leg.

EXAM:
PORTABLE CHEST 1 VIEW

[chest ap]
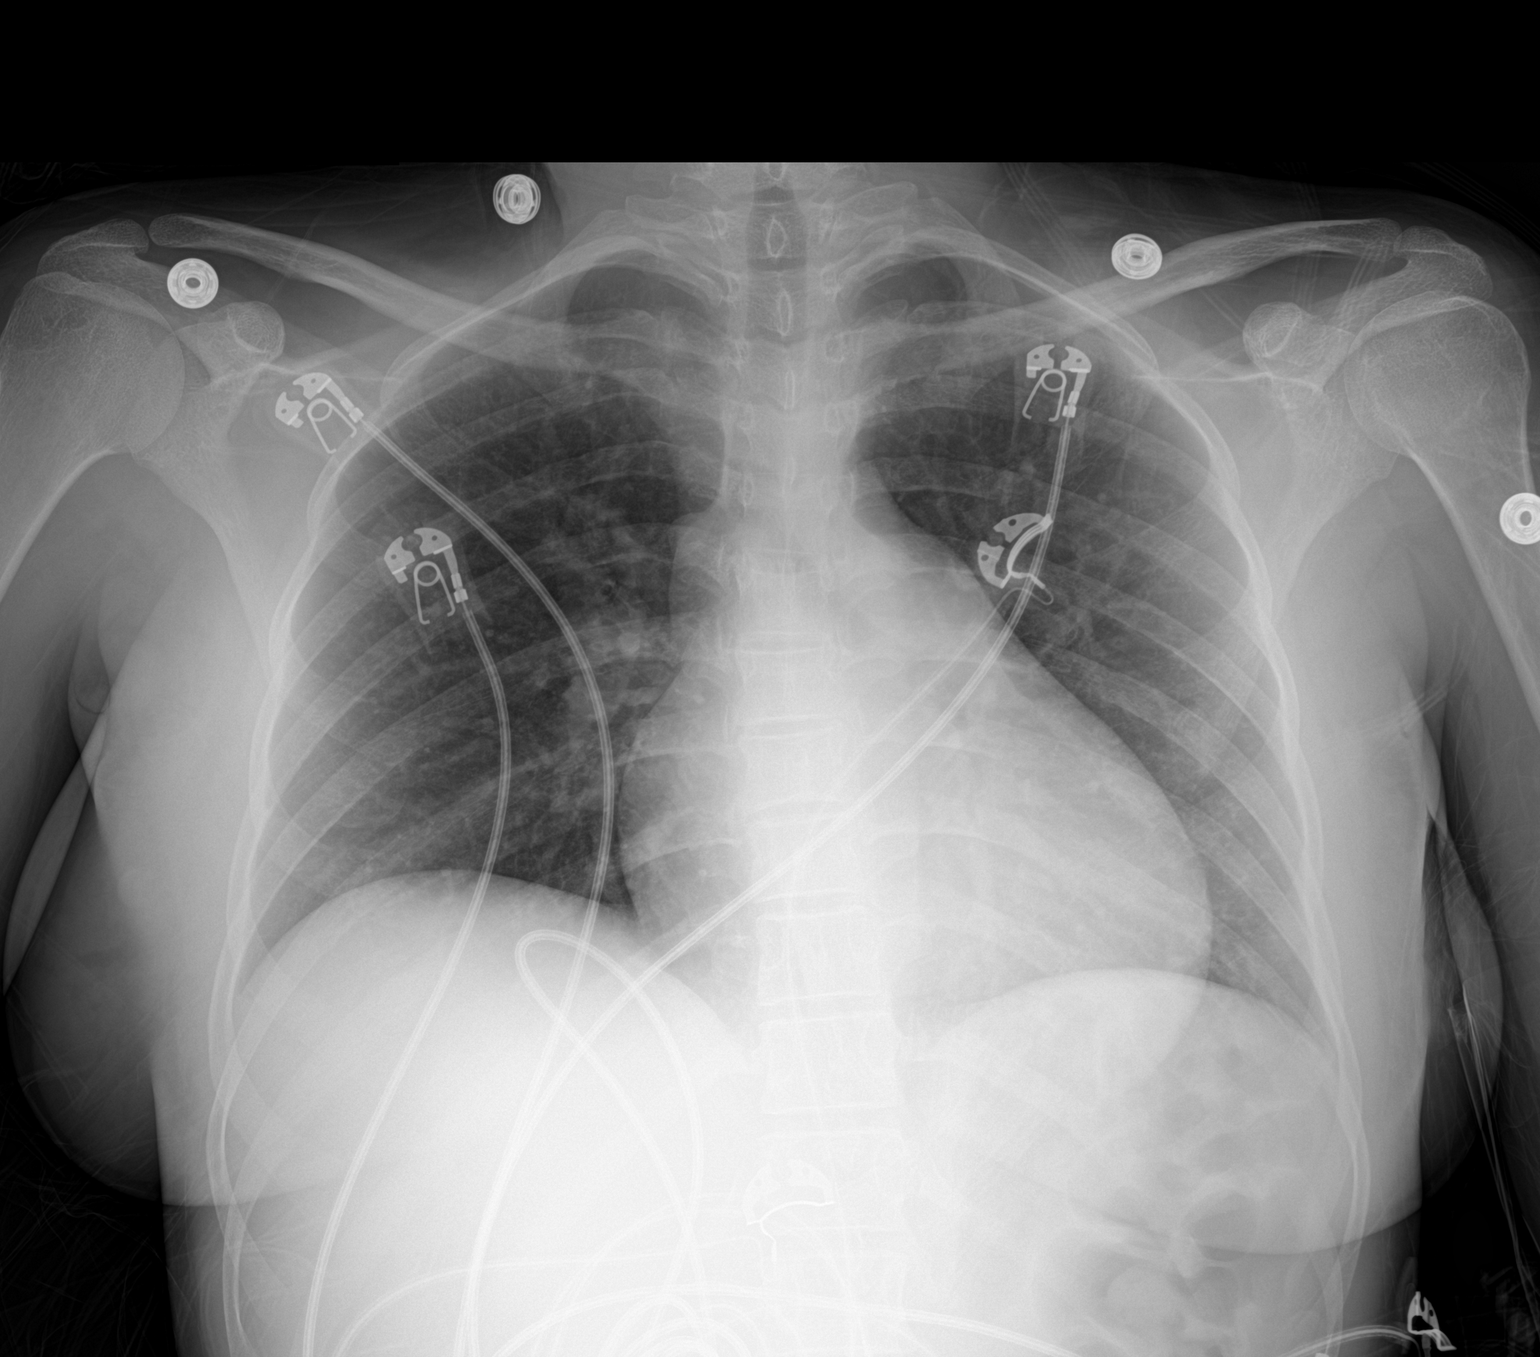

[1 of 1 positions shown; findings below may reference images not displayed]

FINDINGS: Borderline prominence of the main pulmonary artery. The lungs appear
clear. No blunting of the costophrenic angles. No acute thoracic
findings are identified.
IMPRESSION: 1. Borderline prominence of the main pulmonary artery. Otherwise, no
significant abnormalities are observed.
